# Patient Record
Sex: Male | Born: 1995 | Hispanic: No | Marital: Single
Health system: Southern US, Community
[De-identification: ages and names within clinical notes are randomized; demographics above are authoritative.]

---

## 2019-02-07 ENCOUNTER — Other Ambulatory Visit: Payer: Self-pay | Admitting: Family Medicine

## 2019-02-07 DIAGNOSIS — R7401 Elevation of levels of liver transaminase levels: Secondary | ICD-10-CM

## 2019-02-13 ENCOUNTER — Other Ambulatory Visit: Payer: Self-pay | Admitting: Family Medicine

## 2019-02-13 DIAGNOSIS — R220 Localized swelling, mass and lump, head: Secondary | ICD-10-CM

## 2019-02-20 ENCOUNTER — Ambulatory Visit
Admission: RE | Admit: 2019-02-20 | Discharge: 2019-02-20 | Disposition: A | Discharge status: Home / Self Care | Payer: BLUE CROSS/BLUE SHIELD | Source: Ambulatory Visit | Attending: Family Medicine | Admitting: Family Medicine

## 2019-02-20 ENCOUNTER — Encounter (INDEPENDENT_AMBULATORY_CARE_PROVIDER_SITE_OTHER): Payer: Self-pay

## 2019-02-20 ENCOUNTER — Other Ambulatory Visit: Payer: Self-pay

## 2019-02-20 DIAGNOSIS — R740 Nonspecific elevation of levels of transaminase and lactic acid dehydrogenase [LDH]: Secondary | ICD-10-CM | POA: Diagnosis present

## 2019-02-20 DIAGNOSIS — R220 Localized swelling, mass and lump, head: Secondary | ICD-10-CM | POA: Insufficient documentation

## 2019-02-20 DIAGNOSIS — R7401 Elevation of levels of liver transaminase levels: Secondary | ICD-10-CM

## 2019-10-16 ENCOUNTER — Ambulatory Visit: Payer: Self-pay | Attending: Internal Medicine

## 2019-10-16 DIAGNOSIS — Z23 Encounter for immunization: Secondary | ICD-10-CM

## 2019-10-16 NOTE — Progress Notes (Signed)
   Covid-[facility] Vaccination [facility]  Name:  Johnny Guerrero    MRN: [id number] DOB: 03/26/1996  10/16/2019  Mr. Sanroman was observed post Covid-19 immunization for 15 minutes without incident. He was provided with Vaccine Information Sheet and instruction to access the V-Safe system.   Mr. Buening was instructed to call 911 with any severe reactions post vaccine: Marland Kitchen Difficulty breathing  . Swelling of face and throat  . A fast heartbeat  . A bad rash all over body  . Dizziness and weakness   Immunizations Administered    Name Date Dose VIS Date Route   Moderna COVID-19 Vaccine 10/16/2019  3:44 PM 0.5 mL 06/12/2019 Intramuscular   Manufacturer: Moderna   Lot: [id number]   NDC: 53005-110-21

## 2019-11-13 ENCOUNTER — Ambulatory Visit: Payer: Self-pay | Attending: Internal Medicine

## 2019-11-13 DIAGNOSIS — Z23 Encounter for immunization: Secondary | ICD-10-CM

## 2019-11-13 NOTE — Progress Notes (Signed)
   Covid-19 Vaccination Clinic  Name:  Brigg Cape    MRN: [id number] DOB: 1996-01-18  11/13/2019  Mr. Riemenschneider was observed post Covid-19 immunization for 15 minutes without incident. He was provided with Vaccine Information Sheet and instruction to access the V-Safe system.   Mr. Orona was instructed to call 911 with any severe reactions post vaccine: Marland Kitchen Difficulty breathing  . Swelling of face and throat  . A fast heartbeat  . A bad rash all over body  . Dizziness and weakness   Immunizations Administered    Name Date Dose VIS Date Route   Moderna COVID-19 Vaccine 11/13/2019  5:36 PM 0.5 mL 06/2019 Intramuscular   Manufacturer: Moderna   Lot: [id number]   NDC: 73225-672-09

## 2020-09-02 IMAGING — US SOFT TISSUE ULTRASOUND HEAD/NECK
1 series · 6 of 6 positions shown · non-contrast
Comparison: None.

CLINICAL DATA: Bony protrusion of the posterior scalp. Chronic.
Nontender.

EXAM:
ULTRASOUND OF HEAD/NECK SOFT TISSUES
TECHNIQUE: Ultrasound examination of the head and neck soft tissues was
performed in the area of clinical concern.

[Series 1: soft tissue ultrasound head/neck · 6 acquisitions, 6 frames shown]
[im 1/6]
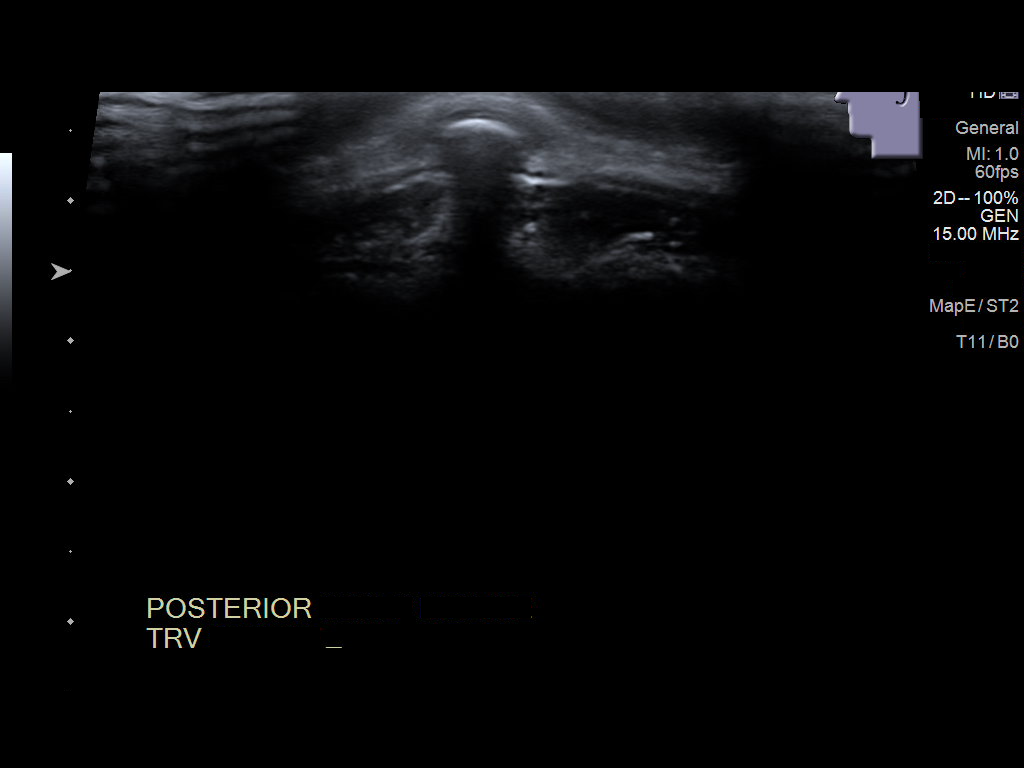
[im 2/6]
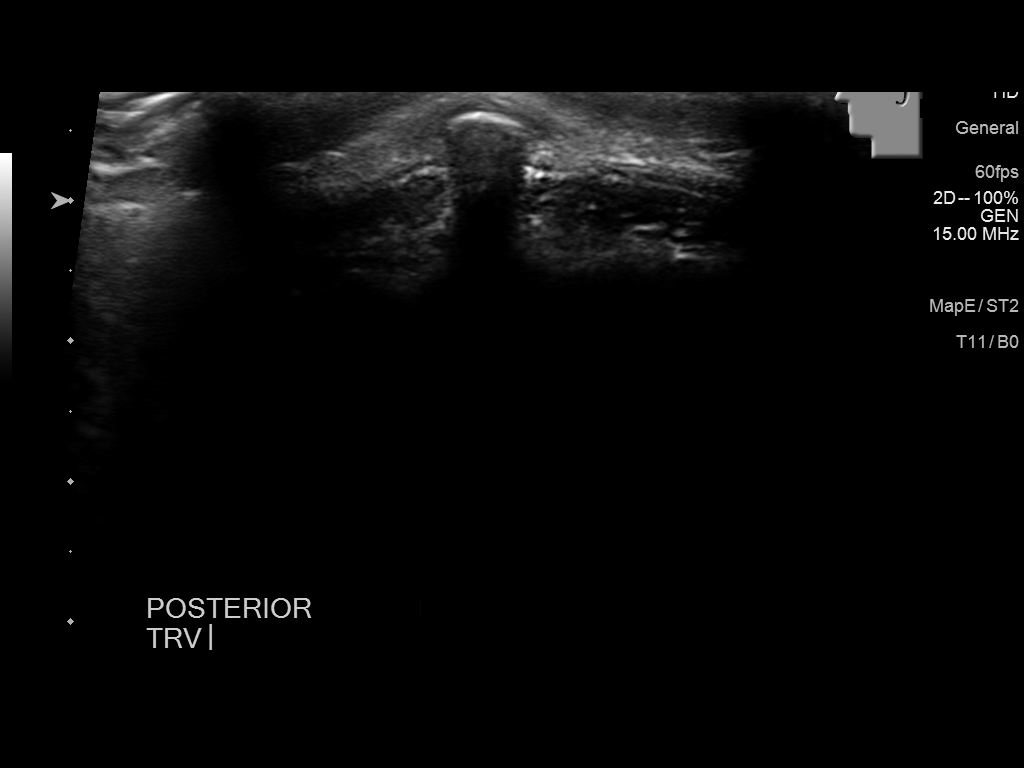
[im 3/6]
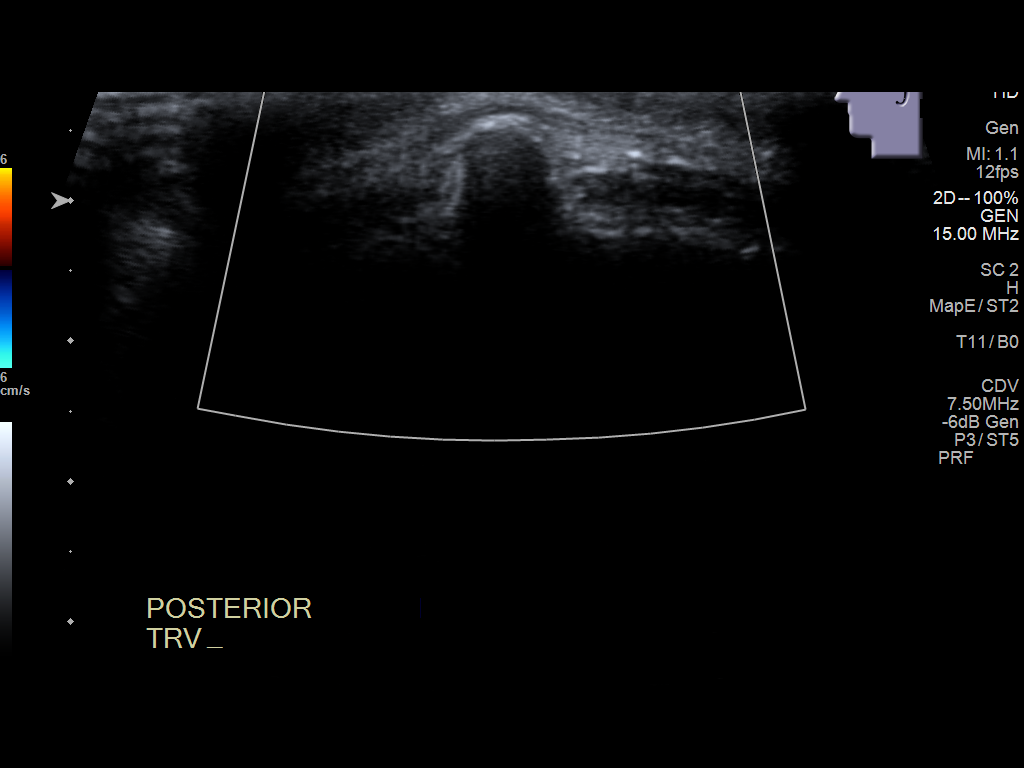
[im 4/6]
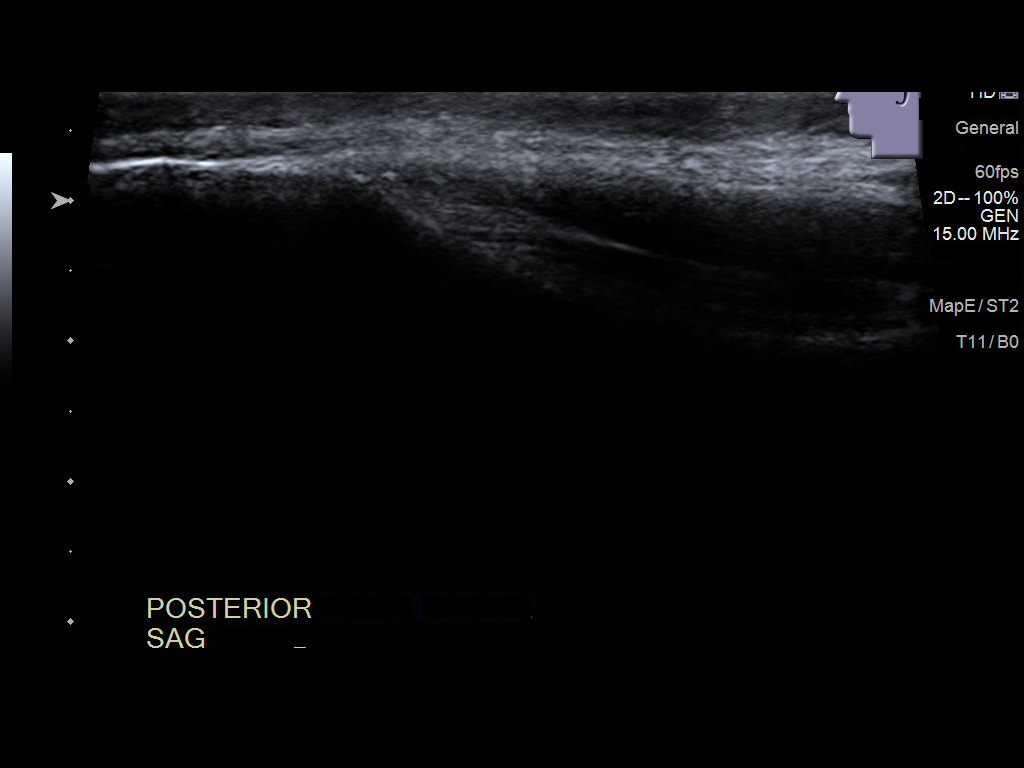
[im 5/6]
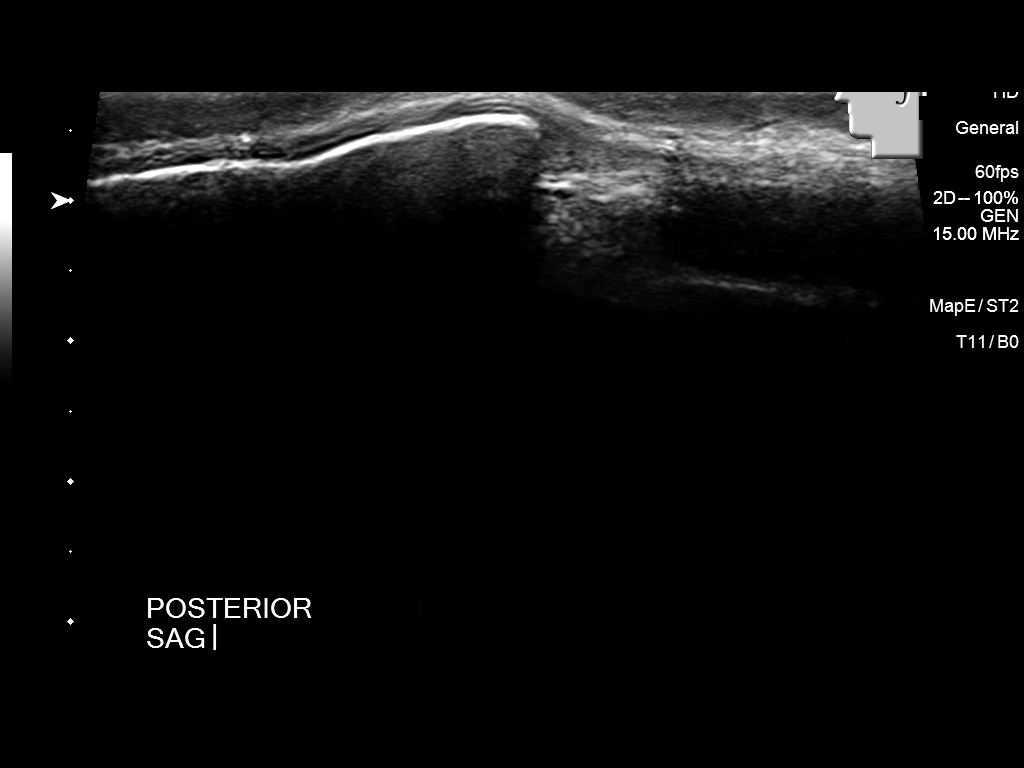
[im 6/6]
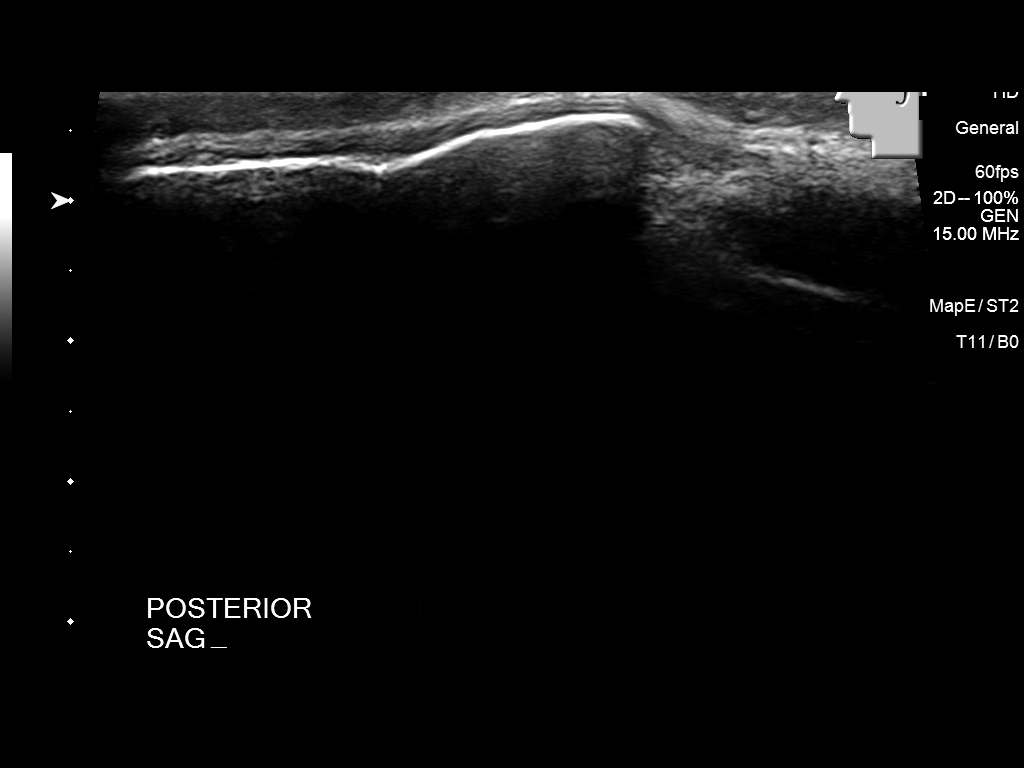

[6 of 6 positions shown; findings below may reference images not displayed]

FINDINGS: Ultrasound scan shows a bony prominence in continuity with the outer
table of the calvarium. Especially given the clinical history, this
is quite likely to be benign. If in the midline, this probably
represents the external occipital protuberance.
IMPRESSION: Benign appearing calvarial protuberance.  See above.

## 2020-09-02 IMAGING — US ULTRASOUND ABDOMEN COMPLETE
1 series · 14 of 25 positions shown · non-contrast
Comparison: None.

CLINICAL DATA: Elevated liver enzymes

EXAM:
ABDOMEN ULTRASOUND COMPLETE

[Series 1: ultrasound abdomen complete · 0.19mm/px · 14 of 99 slices shown]
[im 1/99]
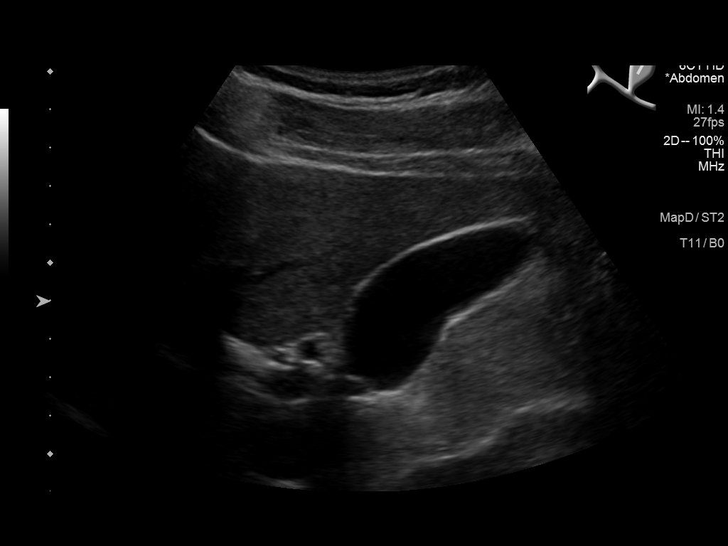
[im 9/99]
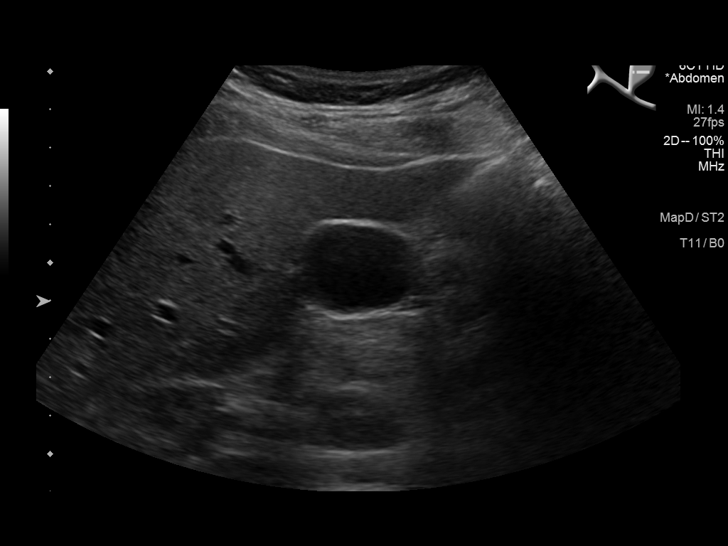
[im 17/99]
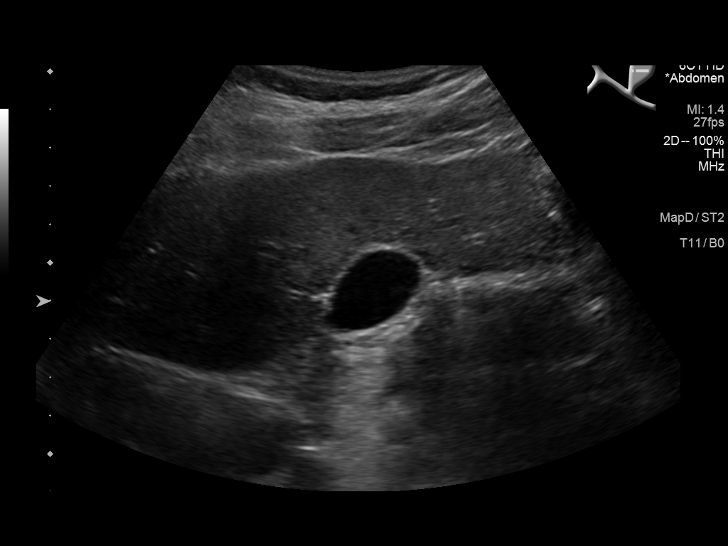
[im 25/99]
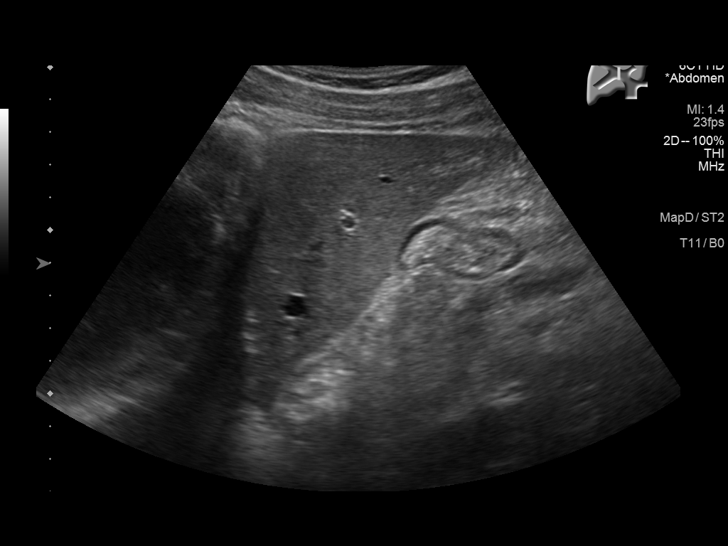
[im 33/99]
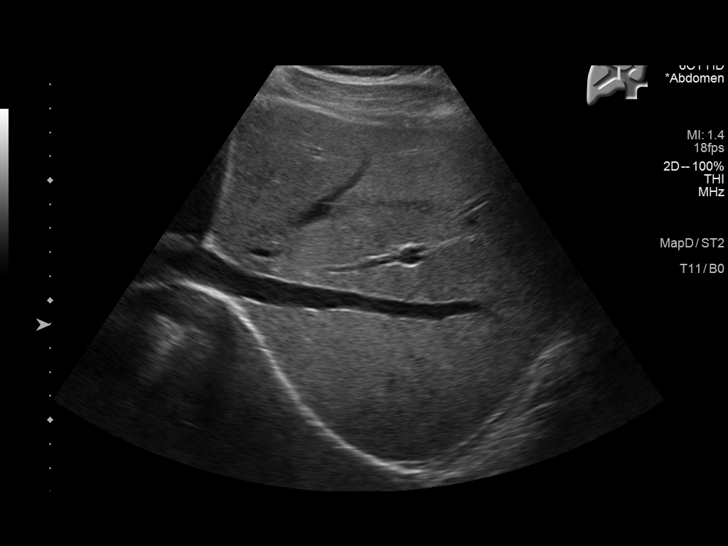
[im 37/99]
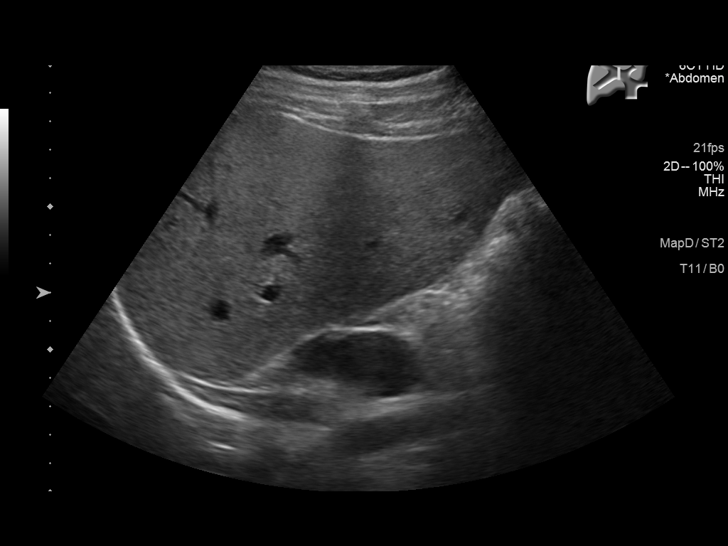
[im 45/99]
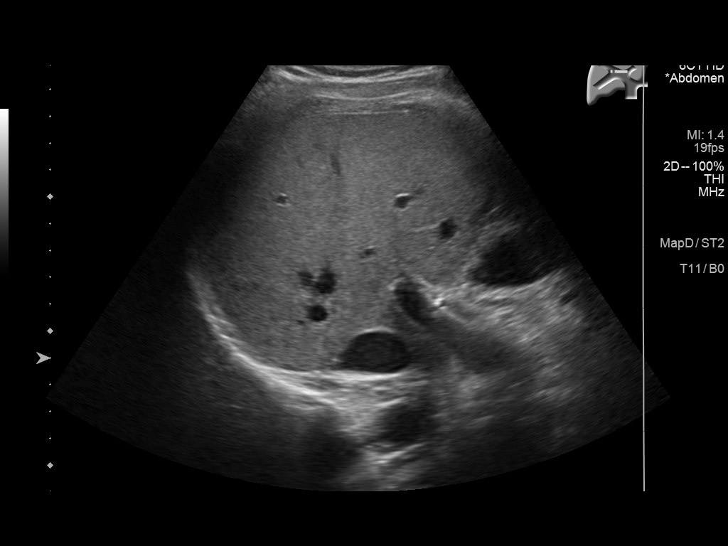
[im 54/99]
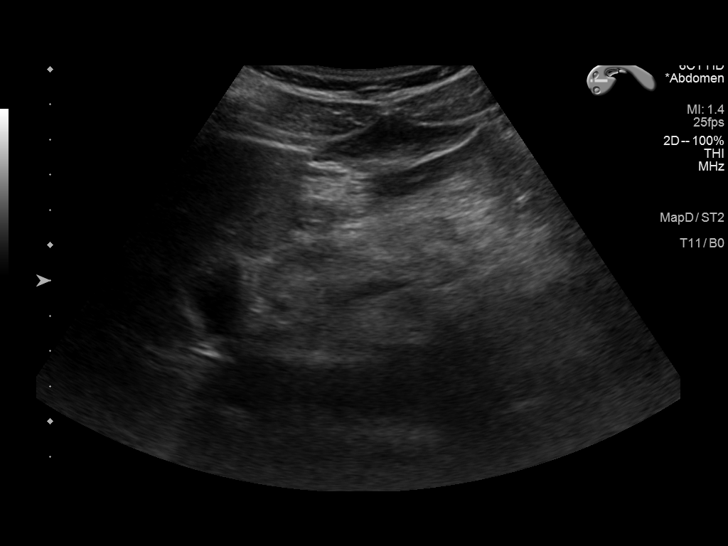
[im 62/99]
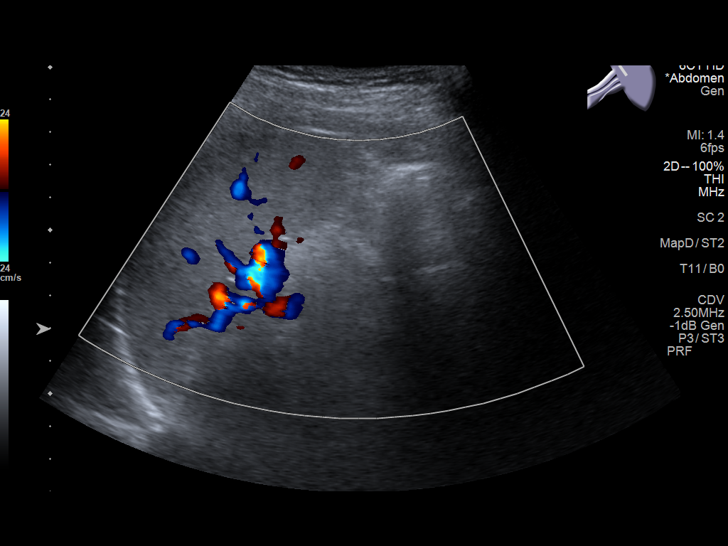
[im 66/99]
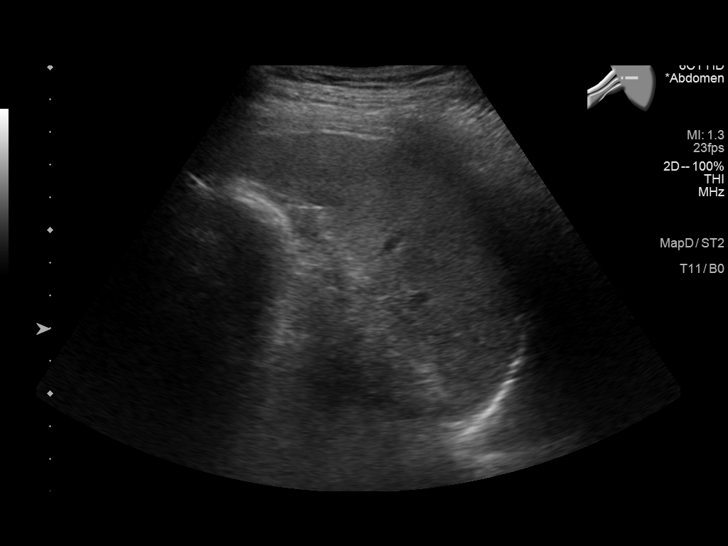
[im 74/99]
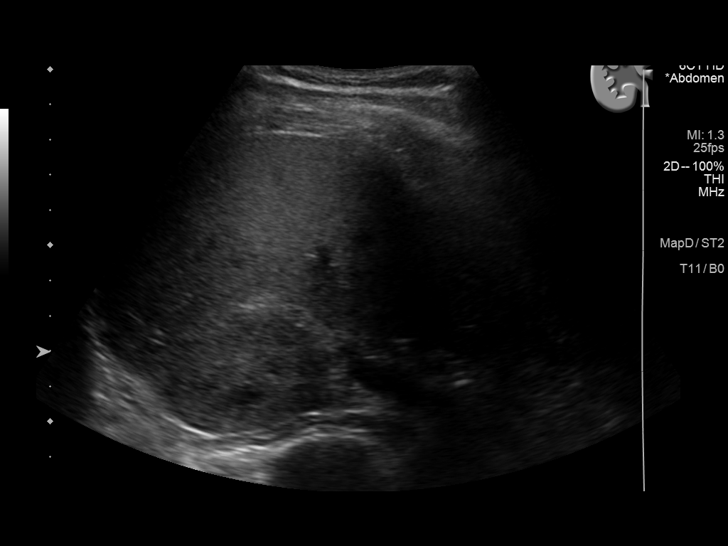
[im 82/99]
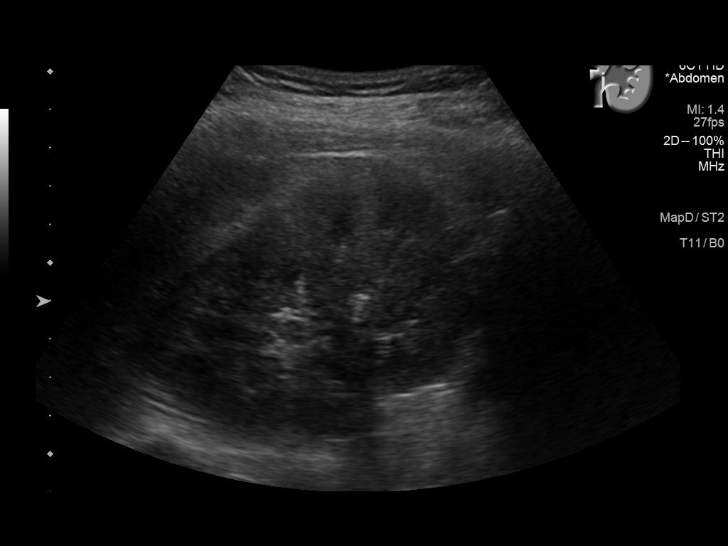
[im 90/99]
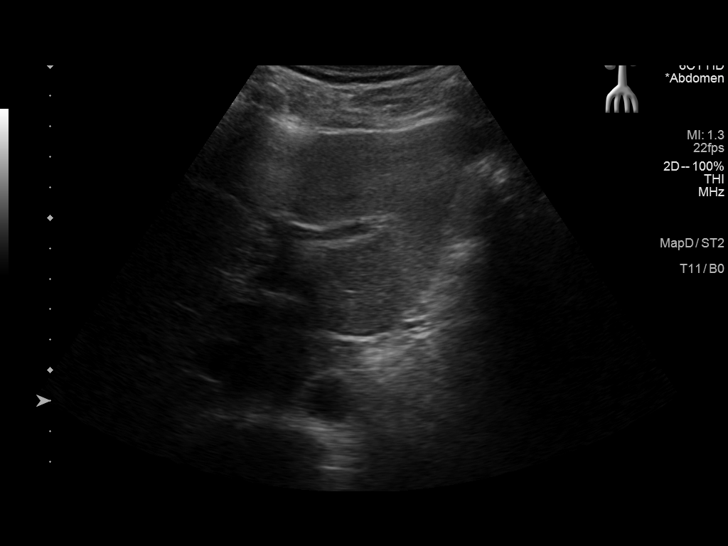
[im 99/99]
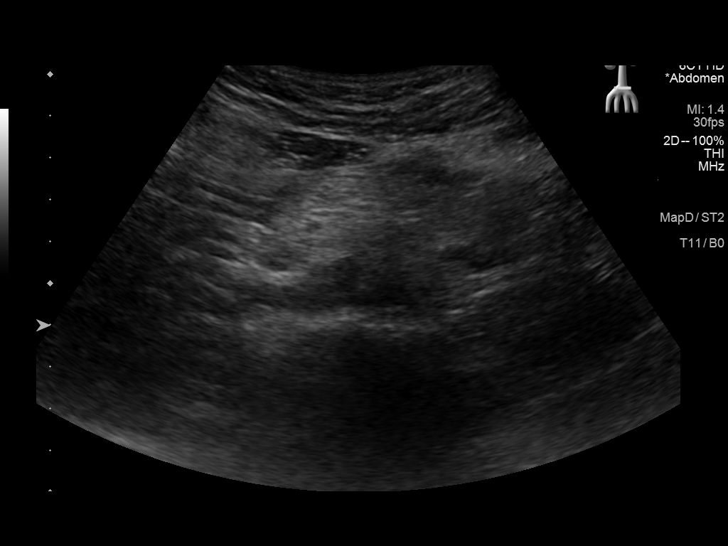

[14 of 25 positions shown; findings below may reference images not displayed]

FINDINGS: Gallbladder: No gallstones or wall thickening visualized. There is
no pericholecystic fluid. No sonographic Murphy sign noted by
sonographer.

Common bile duct: Diameter: 5 mm. No intrahepatic or extrahepatic
biliary duct dilatation.

Liver: No focal lesion identified. Within normal limits in
parenchymal echogenicity. Portal vein is patent on color Doppler
imaging with normal direction of blood flow towards the liver.

IVC: No abnormality visualized.

Pancreas: Visualized portion unremarkable. Portions of pancreas
obscured by gas.

Spleen: Size and appearance within normal limits.

Right Kidney: Length: 9.6 cm. Echogenicity within normal limits. No
mass or hydronephrosis visualized.

Left Kidney: Length: 10.5 cm. Echogenicity within normal limits. No
mass or hydronephrosis visualized.

Abdominal aorta: No aneurysm visualized.

Other findings: No demonstrable ascites.
IMPRESSION: w portions of pancreas obscured by gas. Visualized portions of
pancreas appear normal.

Study otherwise unremarkable.

## 2024-07-09 ENCOUNTER — Ambulatory Visit: Payer: Self-pay
